# Patient Record
Sex: Female | Born: 1950 | Race: White | Hispanic: No
Health system: Midwestern US, Academic
[De-identification: ages and names within clinical notes are randomized; demographics above are authoritative.]

---

## 2017-03-03 ENCOUNTER — Ambulatory Visit: Admit: 2017-03-03 | Discharge: 2017-03-04

## 2017-03-03 ENCOUNTER — Encounter: Admit: 2017-03-03 | Discharge: 2017-03-03

## 2017-03-03 ENCOUNTER — Ambulatory Visit: Admit: 2017-03-03 | Discharge: 2017-03-03

## 2017-03-03 ENCOUNTER — Ambulatory Visit: Admit: 2017-03-03 | Discharge: 2017-03-03 | Payer: MEDICARE

## 2017-03-03 DIAGNOSIS — C649 Malignant neoplasm of unspecified kidney, except renal pelvis: ICD-10-CM

## 2017-03-03 DIAGNOSIS — F419 Anxiety disorder, unspecified: Principal | ICD-10-CM

## 2017-03-03 DIAGNOSIS — I10 Essential (primary) hypertension: ICD-10-CM

## 2017-03-03 DIAGNOSIS — M1990 Unspecified osteoarthritis, unspecified site: ICD-10-CM

## 2017-03-03 DIAGNOSIS — N200 Calculus of kidney: ICD-10-CM

## 2017-03-03 DIAGNOSIS — C642 Malignant neoplasm of left kidney, except renal pelvis: Principal | ICD-10-CM

## 2017-03-03 DIAGNOSIS — R300 Dysuria: ICD-10-CM

## 2017-03-03 DIAGNOSIS — I639 Cerebral infarction, unspecified: ICD-10-CM

## 2017-03-03 DIAGNOSIS — E079 Disorder of thyroid, unspecified: ICD-10-CM

## 2017-03-03 DIAGNOSIS — N3946 Mixed incontinence: ICD-10-CM

## 2017-03-03 DIAGNOSIS — F329 Major depressive disorder, single episode, unspecified: ICD-10-CM

## 2017-03-03 LAB — COMPREHENSIVE METABOLIC PANEL
Lab: 0.5 mg/dL (ref 0.3–1.2)
Lab: 0.9 mg/dL (ref 0.4–1.00)
Lab: 10 mg/dL (ref 8.5–10.6)
Lab: 103 MMOL/L (ref 98–110)
Lab: 107 mg/dL — ABNORMAL HIGH (ref 70–100)
Lab: 137 MMOL/L (ref 137–147)
Lab: 17 U/L (ref 7–56)
Lab: 19 U/L (ref 7–40)
Lab: 21 mg/dL (ref 7–25)
Lab: 26 MMOL/L (ref 21–30)
Lab: 4.2 MMOL/L (ref 3.5–5.1)
Lab: 4.4 g/dL (ref 3.5–5.0)
Lab: 57 mL/min — ABNORMAL LOW (ref >60)
Lab: 67 U/L (ref 25–110)
Lab: 7.3 g/dL (ref 6.0–8.0)
Lab: >60 mL/min (ref >60)
Lab: 8 (ref 3–12)

## 2017-03-03 LAB — URINALYSIS DIPSTICK
Lab: NEGATIVE
Lab: NEGATIVE
Lab: NEGATIVE
Lab: NEGATIVE
Lab: NEGATIVE
Lab: NEGATIVE

## 2017-03-03 LAB — URINALYSIS, MICROSCOPIC

## 2017-03-03 LAB — POC CREATININE, RAD: Lab: 1 mg/dL (ref 0.4–1.00)

## 2017-03-03 MED ORDER — IOPAMIDOL 76 % IV SOLN
80 mL | Freq: Once | INTRAVENOUS | 0 refills | Status: CP
Start: 2017-03-03 — End: ?
  Administered 2017-03-03: 15:00:00 80 mL via INTRAVENOUS

## 2017-03-03 MED ORDER — SODIUM CHLORIDE 0.9 % IJ SOLN
50 mL | Freq: Once | INTRAVENOUS | 0 refills | Status: CP
Start: 2017-03-03 — End: ?
  Administered 2017-03-03: 15:00:00 50 mL via INTRAVENOUS

## 2017-03-03 NOTE — Progress Notes
pvr 68mL

## 2017-03-03 NOTE — Progress Notes
Date of Service: 03/03/2017     Subjective:             Beverly Duarte is a 66 y.o. 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  She is now nearly 4 years out from surgery.  She presents today with a CT scan, CXR, and labs prior.    Patient overall doing well.  No hematuria or flank pain.  She does report a 12# weight loss in the last 3 weeks due to decreased appetite.  She also reports some worsening urge incontinence for which she wears 2 pads a day.  No dysuria.  Nocturia 2-3 times per night.  We have recommended trials of anticholinergics in the past, but patient has declined.    PVR checked today at 17 ml???       Review of Systems   Constitutional: Negative for activity change, appetite change, chills, diaphoresis, fatigue, fever and unexpected weight change.   HENT: Negative for congestion, hearing loss, mouth sores and sinus pressure.    Eyes: Negative for visual disturbance.   Respiratory: Negative for apnea, cough, chest tightness and shortness of breath.    Cardiovascular: Negative for chest pain, palpitations and leg swelling.   Gastrointestinal: Negative for abdominal pain, blood in stool, constipation, diarrhea, nausea, rectal pain and vomiting.   Genitourinary: Negative for decreased urine volume, difficulty urinating, dyspareunia, dysuria, enuresis, flank pain, frequency, genital sores, hematuria, menstrual problem, pelvic pain, urgency, vaginal bleeding, vaginal discharge and vaginal pain.   Musculoskeletal: Negative for arthralgias, back pain, gait problem and myalgias.   Skin: Negative for rash and wound.   Neurological: Negative for dizziness, tremors, seizures, syncope, weakness, light-headedness, numbness and headaches. Hematological: Negative for adenopathy. Does not bruise/bleed easily.   Psychiatric/Behavioral: Negative for decreased concentration and dysphoric mood. The patient is not nervous/anxious.          Objective:         ??? triamterene-hydrochlorothiazide (DYAZIDE) 37.5-25 mg capsule Take 1 Cap by mouth every morning.     Vitals:    03/03/17 1024   BP: 135/82   Pulse: 64   Weight: 94 kg (207 lb 3.2 oz)   Height: 167.6 cm (66)     Body mass index is 33.44 kg/m???.     Physical Exam   Constitutional: She is oriented to person, place, and time. She appears well-developed and well-nourished. No distress.   HENT:   Head: Normocephalic and atraumatic.   Eyes: Conjunctivae and EOM are normal. No scleral icterus.   Neck: Normal range of motion. Neck supple.   Cardiovascular: Normal rate and regular rhythm.    Pulmonary/Chest: Effort normal and breath sounds normal.   Abdominal: Soft. Bowel sounds are normal. She exhibits no distension. There is no tenderness.   Incision well healed   Genitourinary: No vaginal discharge found.   Musculoskeletal: Normal range of motion. She exhibits no edema.   Neurological: She is alert and oriented to person, place, and time.   Skin: Skin is warm and dry. She is not diaphoretic.   Psychiatric: She has a normal mood and affect. Her behavior is normal. Thought content normal.     Comprehensive Metabolic Profile    Lab Results   Component Value Date/Time    NA  137 03/03/2017 09:08 AM    K 4.2 03/03/2017 09:08 AM    CL 103 03/03/2017 09:08 AM    CO2 26 03/03/2017 09:08 AM    GAP 8 03/03/2017 09:08 AM    BUN 21 03/03/2017 09:08 AM    CR 0.98 03/03/2017 09:08 AM    GLU 107 (H) 03/03/2017 09:08 AM    Lab Results   Component Value Date/Time    CA 10.0 03/03/2017 09:08 AM    ALBUMIN 4.4 03/03/2017 09:08 AM    TOTPROT 7.3 03/03/2017 09:08 AM    ALKPHOS 67 03/03/2017 09:08 AM    AST 19 03/03/2017 09:08 AM    ALT 17 03/03/2017 09:08 AM    TOTBILI 0.5 03/03/2017 09:08 AM    GFR 57 (L) 03/03/2017 09:08 AM GFRAA >60 03/03/2017 09:08 AM          CXR (03/03/17)  IMPRESSION  No acute abnormality.    CT Scan (03/03/17)  IMPRESSION  1. ???Prior left nephrectomy without recurrent left renal fossa mass or   abdominopelvic metastatic disease.     Assessment and Plan:    Problem   Mixed Stress and Urge Urinary Incontinence    MUI requiring 2-3 thin pads/day; also bothered by nocturia  (+) lower extremity edema on exam    PVR 02/13/15: 21 mls    03/03/17: PVR 17.  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 Did not get CXR    03/03/17: No evidence of recurrence on exam, labs, CXR, or CT scan         Renal cell carcinoma [facility])  Patient doing well.  No evidence of recurrence.  She will need continued surveillance.  -- F/U one year with labs, CXR, and CT scan  -- Warnings given to patient who expressed understanding    Mixed stress and urge urinary incontinence  Patient declines any medication at this time.  We discussed urge suppression and timed/doubling voiding.  -- Will call if she wants further evaluation  -- Will perform symptom check in one year if not earlier

## 2017-03-24 NOTE — Assessment & Plan Note
Patient doing well.  No evidence of recurrence.  She will need continued surveillance.  -- F/U one year with labs, CXR, and CT scan  -- Warnings given to patient who expressed understanding

## 2017-03-24 NOTE — Assessment & Plan Note
Patient declines any medication at this time.  We discussed urge suppression and timed/doubling voiding.  -- Will call if she wants further evaluation  -- Will perform symptom check in one year if not earlier

## 2018-01-05 ENCOUNTER — Encounter: Admit: 2018-01-05 | Discharge: 2018-01-05

## 2018-02-17 ENCOUNTER — Encounter: Admit: 2018-02-17 | Discharge: 2018-02-17

## 2018-04-20 ENCOUNTER — Encounter: Admit: 2018-04-20 | Discharge: 2018-04-20

## 2018-06-15 ENCOUNTER — Ambulatory Visit: Admit: 2018-06-15 | Discharge: 2018-06-15

## 2018-06-15 ENCOUNTER — Encounter: Admit: 2018-06-15 | Discharge: 2018-06-15

## 2018-06-15 ENCOUNTER — Ambulatory Visit: Admit: 2018-06-15 | Discharge: 2018-06-15 | Payer: MEDICARE

## 2018-06-15 DIAGNOSIS — C649 Malignant neoplasm of unspecified kidney, except renal pelvis: ICD-10-CM

## 2018-06-15 DIAGNOSIS — F419 Anxiety disorder, unspecified: Principal | ICD-10-CM

## 2018-06-15 DIAGNOSIS — C642 Malignant neoplasm of left kidney, except renal pelvis: Principal | ICD-10-CM

## 2018-06-15 DIAGNOSIS — E079 Disorder of thyroid, unspecified: ICD-10-CM

## 2018-06-15 DIAGNOSIS — I639 Cerebral infarction, unspecified: ICD-10-CM

## 2018-06-15 DIAGNOSIS — I10 Essential (primary) hypertension: ICD-10-CM

## 2018-06-15 DIAGNOSIS — M1990 Unspecified osteoarthritis, unspecified site: ICD-10-CM

## 2018-06-15 DIAGNOSIS — N200 Calculus of kidney: ICD-10-CM

## 2018-06-15 DIAGNOSIS — F329 Major depressive disorder, single episode, unspecified: ICD-10-CM

## 2018-06-15 MED ORDER — IOHEXOL 350 MG IODINE/ML IV SOLN
100 mL | Freq: Once | INTRAVENOUS | 0 refills | Status: CP
Start: 2018-06-15 — End: ?
  Administered 2018-06-15: 15:00:00 100 mL via INTRAVENOUS

## 2018-06-15 MED ORDER — OXYBUTYNIN CHLORIDE 10 MG PO TR24
10 mg | ORAL_TABLET | Freq: Every day | ORAL | 3 refills | 12.00000 days | Status: AC
Start: 2018-06-15 — End: ?

## 2018-06-15 MED ORDER — SODIUM CHLORIDE 0.9 % IJ SOLN
50 mL | Freq: Once | INTRAVENOUS | 0 refills | Status: CP
Start: 2018-06-15 — End: ?
  Administered 2018-06-15: 15:00:00 50 mL via INTRAVENOUS

## 2018-06-20 ENCOUNTER — Encounter: Admit: 2018-06-20 | Discharge: 2018-06-20

## 2018-06-20 DIAGNOSIS — I10 Essential (primary) hypertension: ICD-10-CM

## 2018-06-20 DIAGNOSIS — E079 Disorder of thyroid, unspecified: ICD-10-CM

## 2018-06-20 DIAGNOSIS — M1990 Unspecified osteoarthritis, unspecified site: ICD-10-CM

## 2018-06-20 DIAGNOSIS — F419 Anxiety disorder, unspecified: Principal | ICD-10-CM

## 2018-06-20 DIAGNOSIS — F329 Major depressive disorder, single episode, unspecified: ICD-10-CM

## 2018-06-20 DIAGNOSIS — C649 Malignant neoplasm of unspecified kidney, except renal pelvis: ICD-10-CM

## 2018-06-20 DIAGNOSIS — I639 Cerebral infarction, unspecified: ICD-10-CM

## 2018-06-20 DIAGNOSIS — N200 Calculus of kidney: ICD-10-CM

## 2019-07-14 ENCOUNTER — Encounter: Admit: 2019-07-14 | Discharge: 2019-07-14 | Payer: MEDICARE

## 2019-07-16 ENCOUNTER — Encounter: Admit: 2019-07-16 | Discharge: 2019-07-16 | Payer: MEDICARE

## 2019-10-14 ENCOUNTER — Encounter: Admit: 2019-10-14 | Discharge: 2019-10-14 | Payer: MEDICARE

## 2019-10-14 DIAGNOSIS — C642 Malignant neoplasm of left kidney, except renal pelvis: Secondary | ICD-10-CM

## 2020-09-13 ENCOUNTER — Encounter: Admit: 2020-09-13 | Discharge: 2020-09-13 | Payer: MEDICARE

## 2020-09-13 ENCOUNTER — Ambulatory Visit: Admit: 2020-09-13 | Discharge: 2020-09-13 | Payer: MEDICARE

## 2020-09-13 DIAGNOSIS — R0902 Hypoxemia: Secondary | ICD-10-CM

## 2020-09-13 DIAGNOSIS — F419 Anxiety disorder, unspecified: Secondary | ICD-10-CM

## 2020-09-13 DIAGNOSIS — J982 Interstitial emphysema: Secondary | ICD-10-CM

## 2020-09-13 DIAGNOSIS — R55 Syncope and collapse: Secondary | ICD-10-CM

## 2020-09-13 DIAGNOSIS — J1282 Pneumonia due to coronavirus disease 2019 (CODE): Secondary | ICD-10-CM

## 2020-09-13 DIAGNOSIS — U071 Infection due to 2019-nCoV: Secondary | ICD-10-CM

## 2020-09-13 DIAGNOSIS — R5383 Other fatigue: Secondary | ICD-10-CM

## 2020-12-20 ENCOUNTER — Encounter: Admit: 2020-12-20 | Discharge: 2020-12-20 | Payer: MEDICARE

## 2020-12-31 IMAGING — US ECHOCOMPL
1 series · 13 of 24 positions shown · non-contrast
Comparison: none

[Series 1: us echo 2d, complete · 48 acquisitions, 13 frames shown]
[im 1/48]
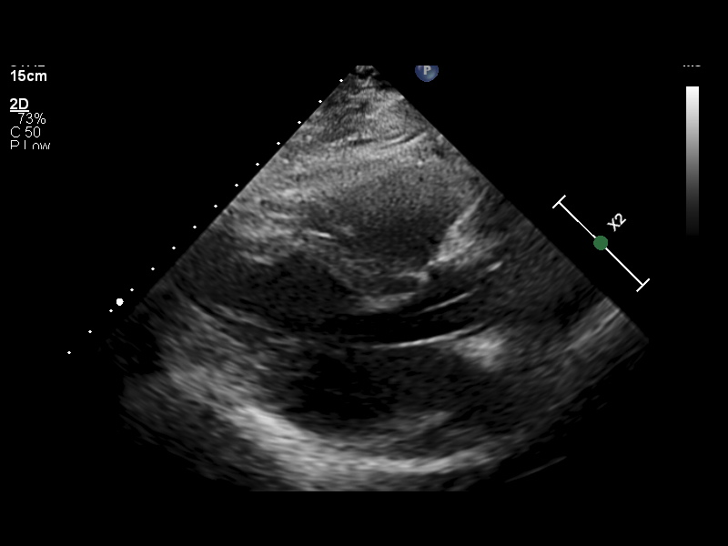
[im 3/48]
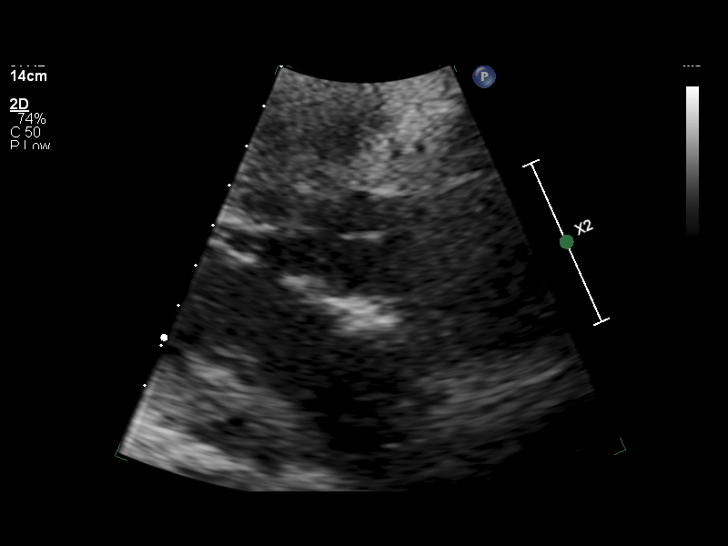
[im 7/48]
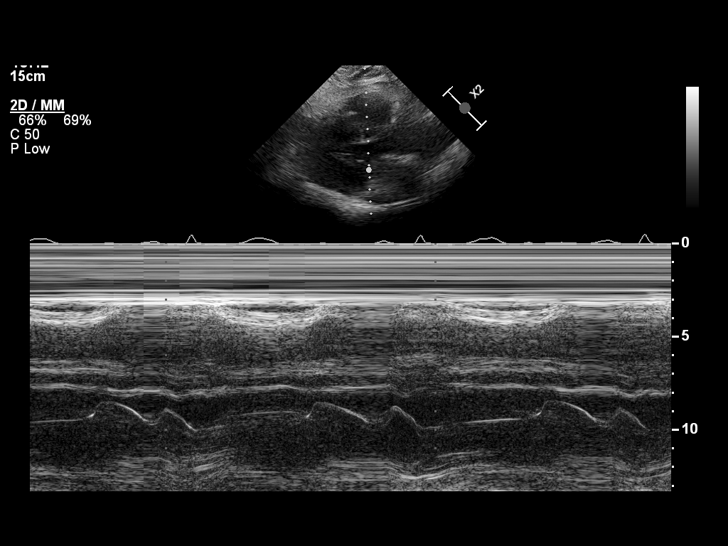
[im 13/48]
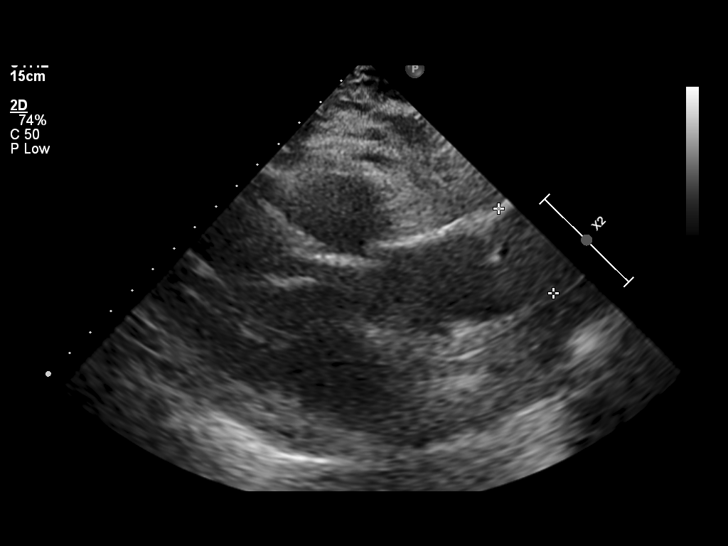
[im 17/48]
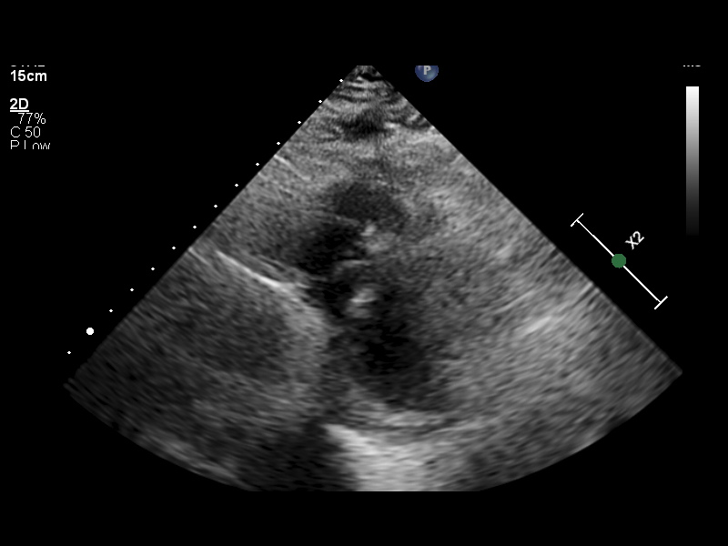
[im 21/48]
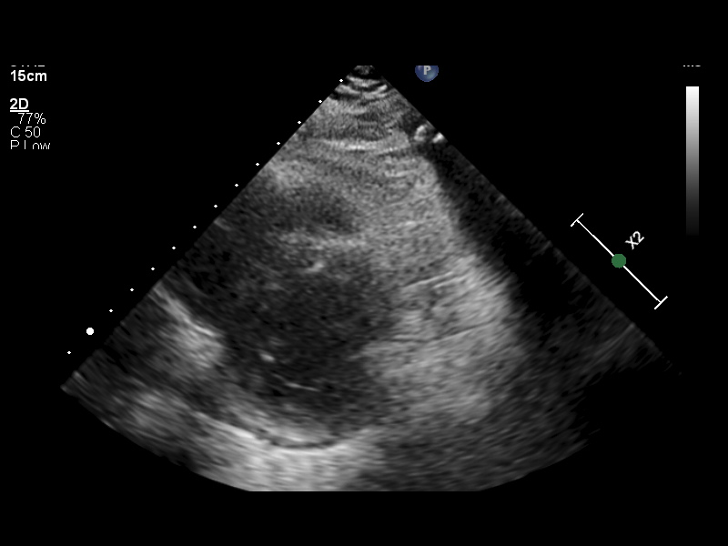
[im 23/48]
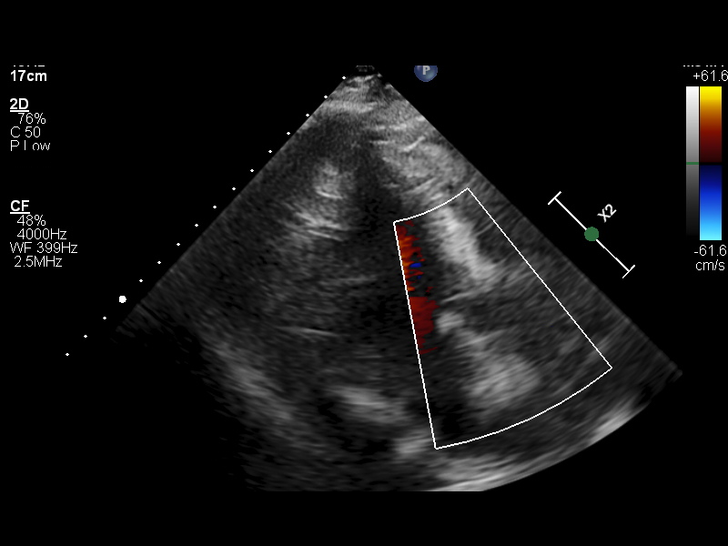
[im 27/48]
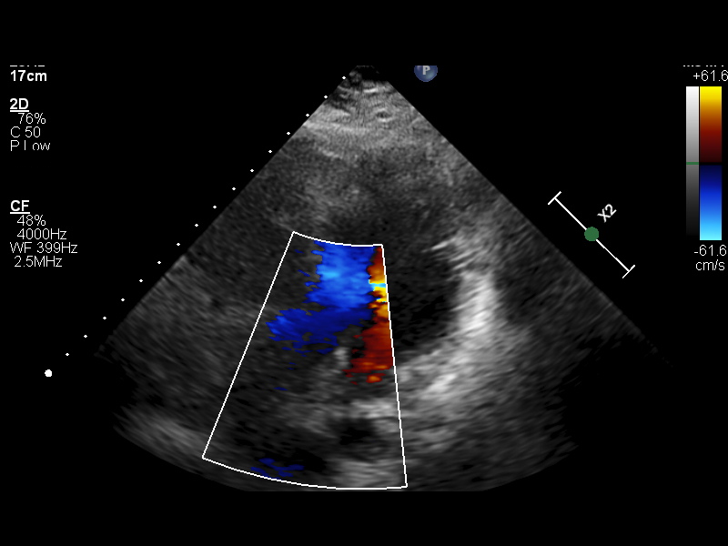
[im 31/48]
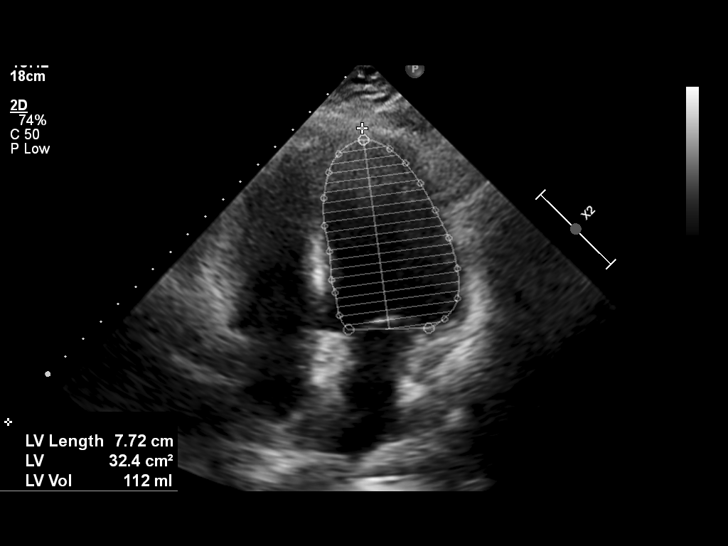
[im 35/48]
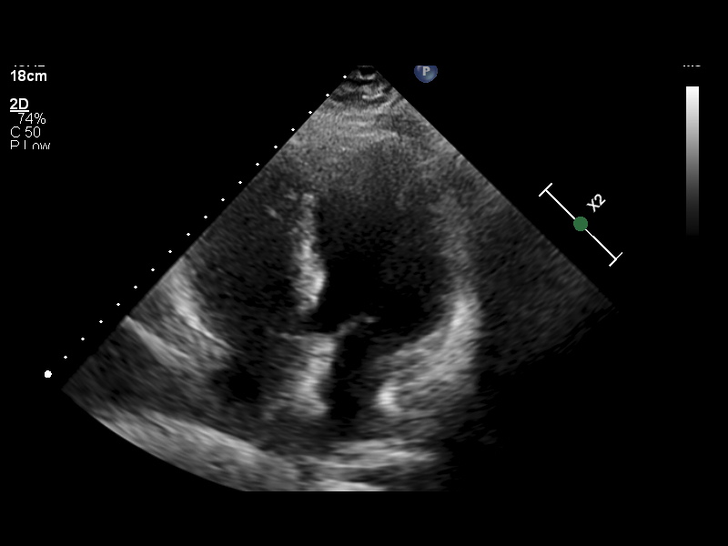
[im 39/48]
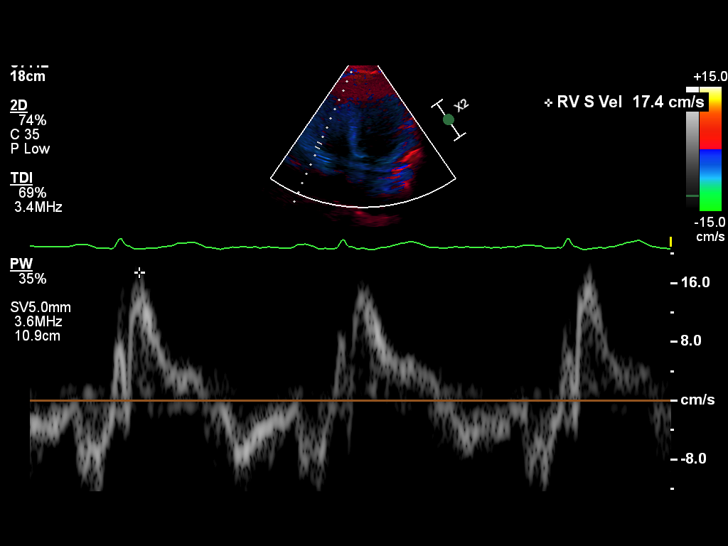
[im 43/48]
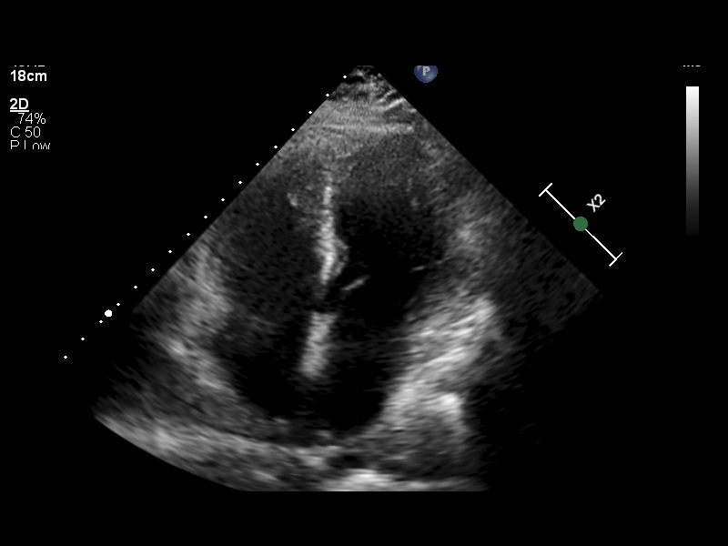
[im 48/48]
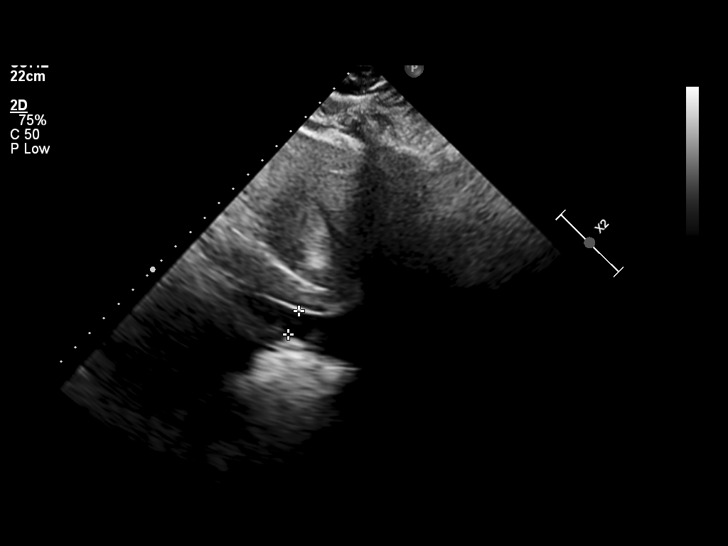

[13 of 24 positions shown; findings below may reference images not displayed]

09/13/20 -  2D + DOPPLER ECHO
Location Performed: [HOSPITAL]

Referring Provider:
Fellow:
Location of Interp:
Sonographer: External Staff

Indications:      Syncope

Vitals
Height   Weight   BSA (Calculated)   BP   Comments
165.1 cm (65")   93.9 kg (207 lb)   2.07   141/75

Interpretation Summary
Normal left ventricular systolic function, estimated ejection fraction is 60%.
Normal left ventricular diastolic function. Normal left atrial pressure.
The right ventricular size, wall thickness and systolic function are normal.
No valvular abnormalities.
Pulmonary artery pressure could not be calculated by this study due to incomplete TR signal.

Echocardiographic Findings
Left Ventricle   Normal left ventricular systolic function, estimated ejection fraction is  60%.
Normal left ventricular diastolic function. Normal left atrial pressure.
Right Ventricle   The right ventricular size, wall thickness and systolic function are normal.
Left Atrium   Normal size.
Right Atrium   Normal size.
IVC/SVC   Normal central venous pressure (0-5 mm Hg).
Mitral Valve   Normal valve structure. No stenosis. No regurgitation.
Tricuspid Valve   Normal valve structure. No stenosis. No regurgitation.
Aortic Valve   Normal valve structure. No stenosis. No regurgitation.
Pulmonary   The pulmonic valve was not well seen.
The pulmonary artery was not well seen.
Aorta   Normal aorta.
Pericardium   No pericardial effusion.

Left Ventricular Wall Scoring
Resting   Score Index: 1.000   Percent Normal: 100.0%

The left ventricular wall motion is normal.

Left Heart 2D Measurements (Normal Ranges)
LVIDD
4.1 cm (Range: 3.8 - 5.2)
LVIDS
3.0 cm (Range: 2.2 - 3.5)
IVS
0.9 cm (Range: 0.6 - 0.9)
LV PW
1.1 cm (Range: 0.6 - 0.9)
LA Size
3.9 cm (Range: 2.7 - 3.8)

Right Heart 2D   M-Mode Measurements (Normal Ranges) (Range)
RV Basal Dia
3.4 cm (2.5 - 4.1)
RV Mid Dia
3.2 cm (1.9 - 3.5)
[REDACTED].5 cm2 (<18)
M-Mode TAPSE
1.9 cm (>1.7)

Left Heart 2D Addnl Measurements (Normal Ranges)
LV Systolic Vol
25 mL (Range: 14 - 42)
LV Systolic Vol Index
12 mL (Range: 8 - 24)
LV Diastolic Vol
95 mL (Range: 46 - 106)
LV Diastolic Vol Index
46 mL (Range: 29 - 61)
LA Vol
44 mL (Range: 22 - 52)
LA Vol Index
21.26 (Range: 16 - 34)
LV Mass
132 g (Range: 67 - 162)
LV Mass Index
64 g/m2 (Range: 43 - 95)
RWT
0.54 (Range: <=0.42)

Aortic Root Measurements (Normal Ranges)
Sinus
2.8 cm (Range: 2.4 - 3.6)
AO Prox
3.4 cm (Range: 1.9 - 3.5)

Tech Notes:

tm

## 2021-01-17 IMAGING — CR [ID]
3 series · 3 of 3 positions shown · non-contrast
Comparison: none

[foot ap]
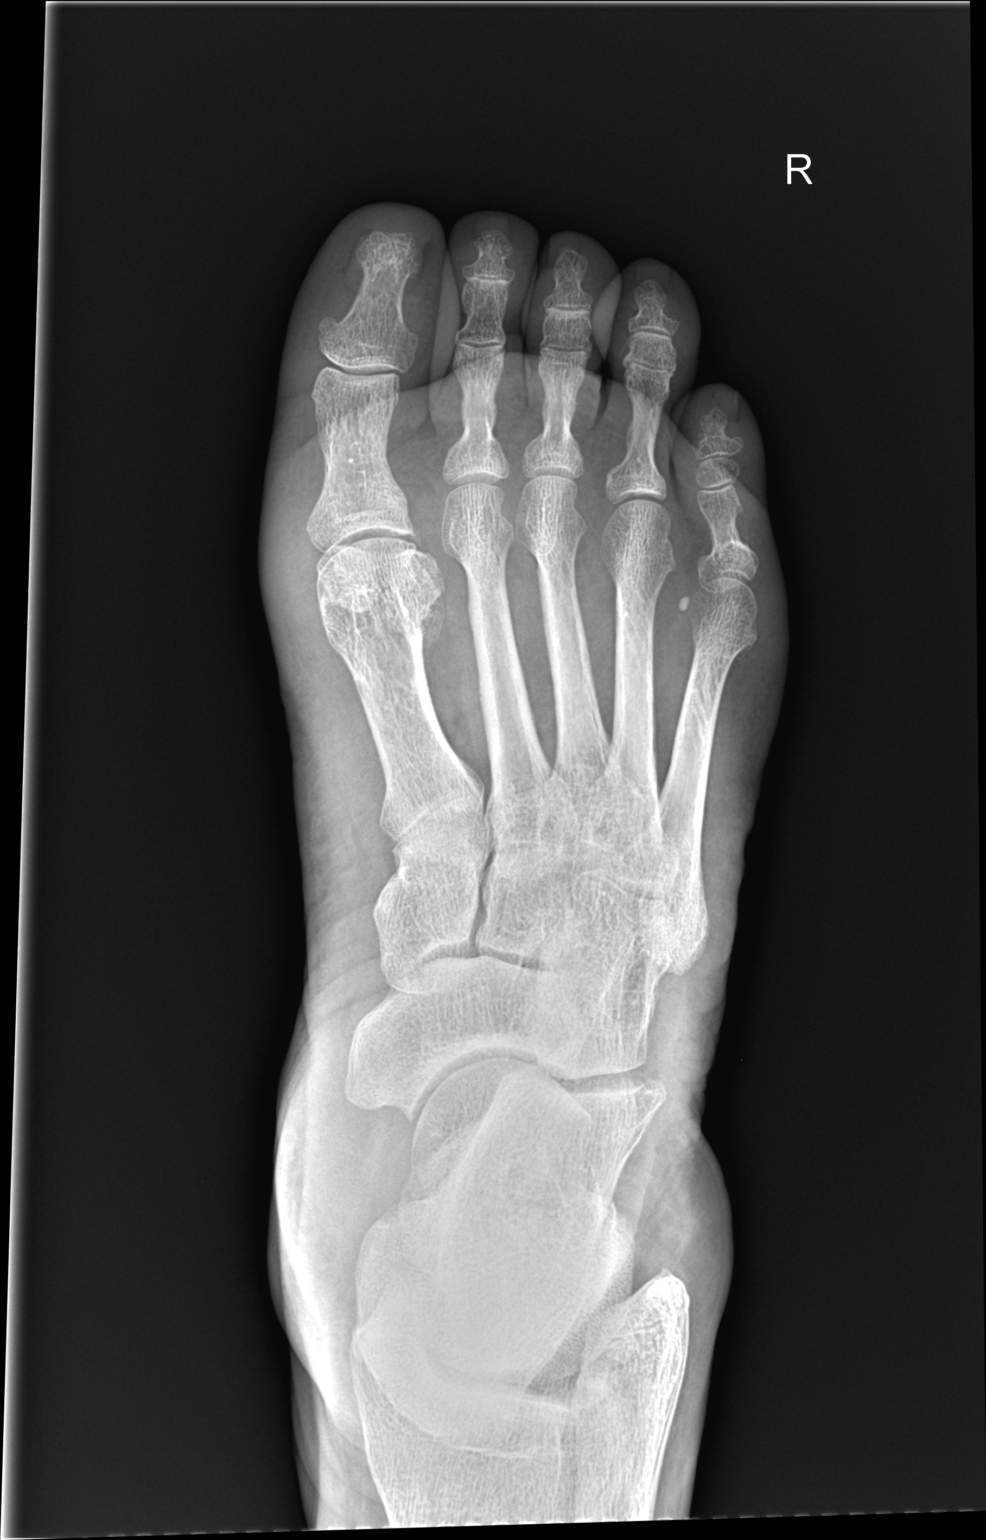

[foot obl]
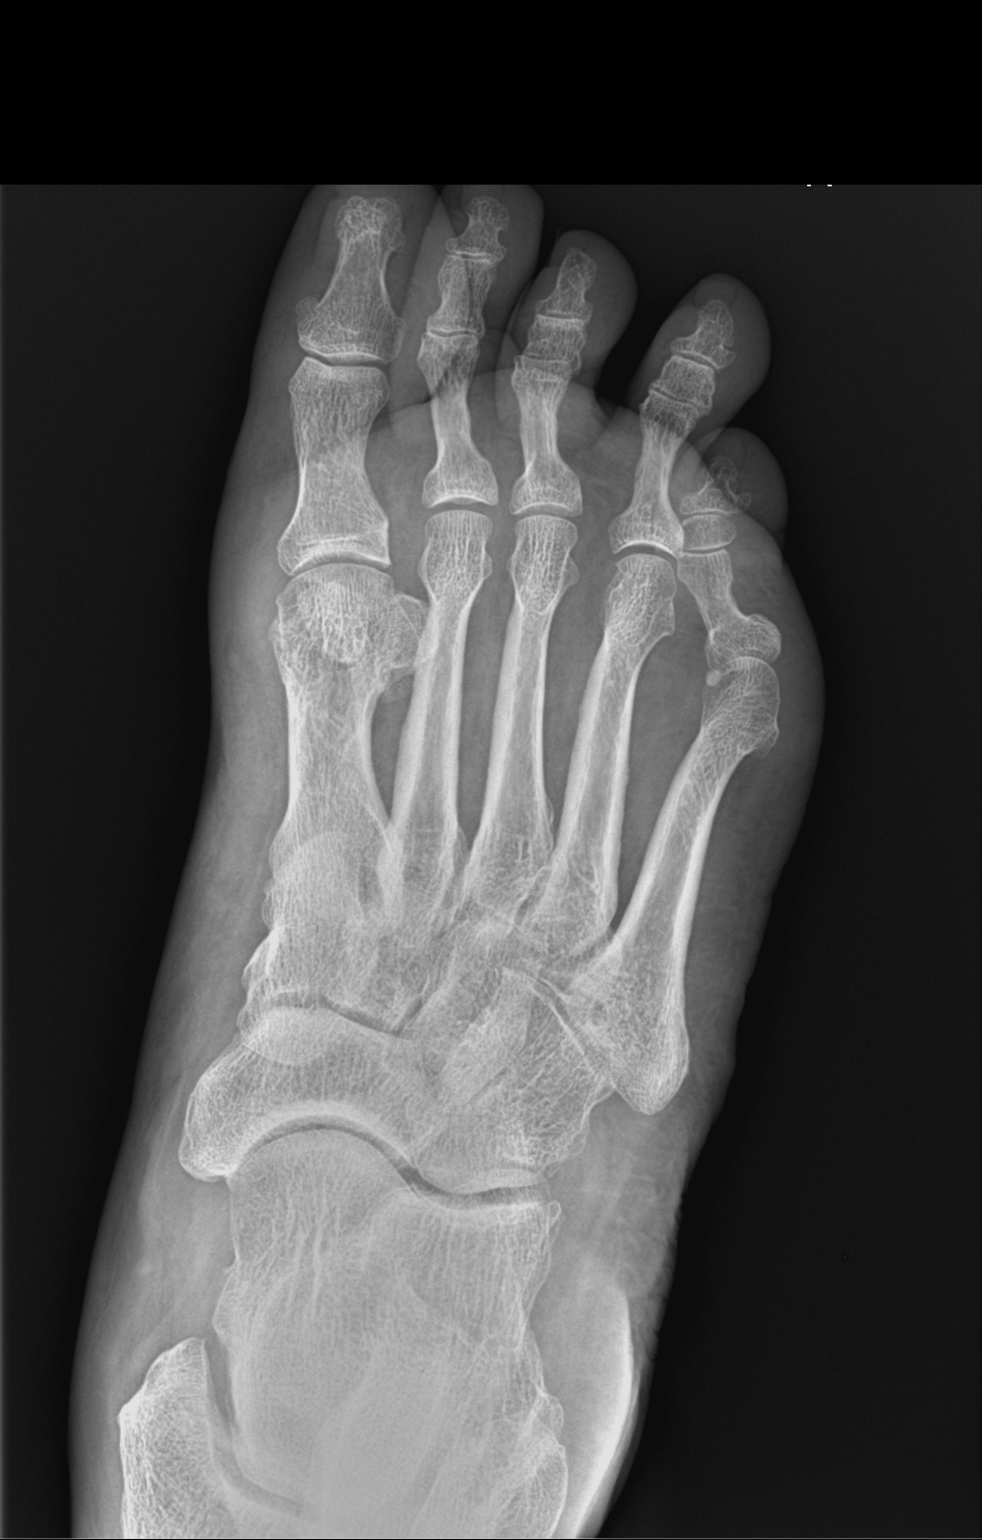

[foot lat]
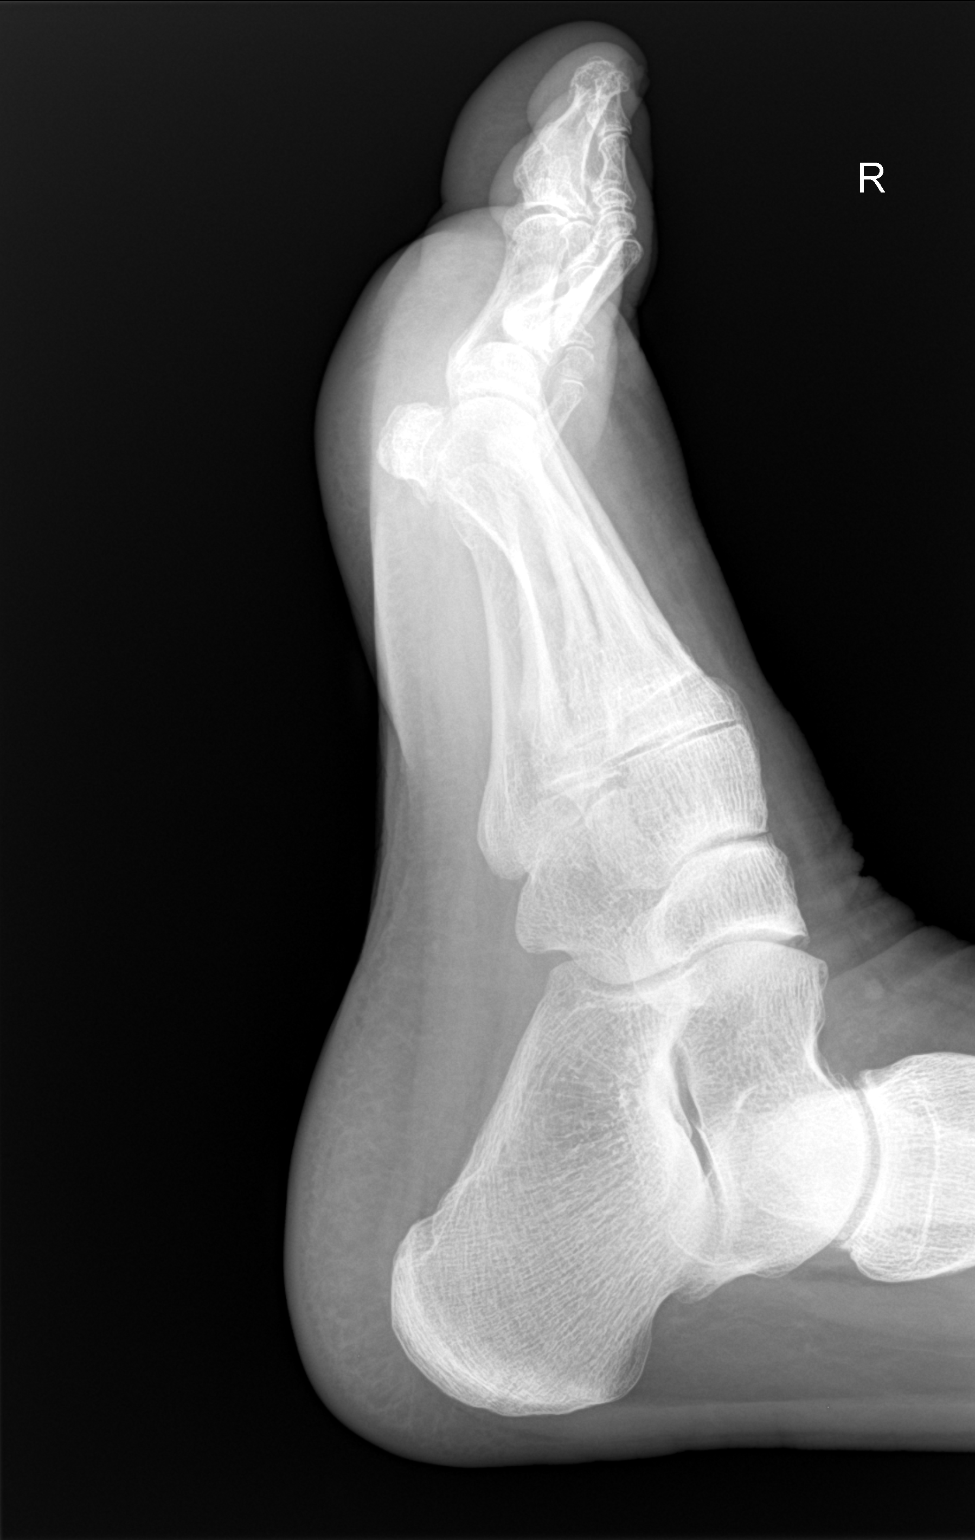

[3 of 3 positions shown; findings below may reference images not displayed]

DIAGNOSTIC STUDIES

EXAM

XR foot RT min 3V

INDICATION

pain 1st mtp red streak
FOOT PAIN THAT STARTED YESTERDAY. RED SWOLLEN AREA NEAR GREAT TOE. RED STREAKS EXTENDING FROM TOE
TOWARD ANKLE. NO TRAUMA/INJURY.   HB

TECHNIQUE

Three views of the right foot were obtained

COMPARISONS

None available

FINDINGS

No acute fracture or dislocation. There are mild degenerative changes involving the midfoot and at
the 1st MTP joint. No osseous erosion or evidence of inflammatory arthritis. Soft tissue edema along
the medial aspect of the 1st MTP joint.

IMPRESSION

No acute osseous abnormality.

Tech Notes:

FOOT PAIN THAT STARTED YESTERDAY. RED SWOLLEN AREA NEAR GREAT TOE. RED STREAKS EXTENDING FROM TOE
TOWARD ANKLE. NO TRAUMA/INJURY.
HB

## 2021-05-18 ENCOUNTER — Encounter: Admit: 2021-05-18 | Discharge: 2021-05-18 | Payer: MEDICARE

## 2021-05-18 DIAGNOSIS — C642 Malignant neoplasm of left kidney, except renal pelvis: Secondary | ICD-10-CM

## 2021-05-24 ENCOUNTER — Encounter: Admit: 2021-05-24 | Discharge: 2021-05-24 | Payer: MEDICARE

## 2021-08-02 ENCOUNTER — Encounter: Admit: 2021-08-02 | Discharge: 2021-08-02 | Payer: MEDICARE

## 2021-08-02 DIAGNOSIS — C642 Malignant neoplasm of left kidney, except renal pelvis: Secondary | ICD-10-CM

## 2021-08-02 DIAGNOSIS — N281 Cyst of kidney, acquired: Secondary | ICD-10-CM

## 2021-08-03 ENCOUNTER — Encounter: Admit: 2021-08-03 | Discharge: 2021-08-03 | Payer: MEDICARE

## 2021-08-20 ENCOUNTER — Encounter: Admit: 2021-08-20 | Discharge: 2021-08-20 | Payer: MEDICARE

## 2021-08-20 NOTE — Telephone Encounter
Received call from patient requesting return call regarding clarification of which imaging she is scheduled to have 08/27/21 prior to her appt with Dr. Morene Crocker. Returned call and spoke to patient. Advised due to insurance/imaging coverage issue, Dr. Morene Crocker has changed orders to [facility] for her imaging. Reminded her to please get a CXR and labs prior to checking in for her appt. She verbalized understanding and thanked me for return call. No further questions.

## 2021-08-27 ENCOUNTER — Ambulatory Visit: Admit: 2021-08-27 | Discharge: 2021-08-27 | Payer: MEDICARE

## 2021-08-27 ENCOUNTER — Encounter: Admit: 2021-08-27 | Discharge: 2021-08-27 | Payer: MEDICARE

## 2021-08-27 DIAGNOSIS — N200 Calculus of kidney: Secondary | ICD-10-CM

## 2021-08-27 DIAGNOSIS — F32A Depression: Secondary | ICD-10-CM

## 2021-08-27 DIAGNOSIS — C649 Malignant neoplasm of unspecified kidney, except renal pelvis: Secondary | ICD-10-CM

## 2021-08-27 DIAGNOSIS — F419 Anxiety disorder, unspecified: Secondary | ICD-10-CM

## 2021-08-27 DIAGNOSIS — I10 Essential (primary) hypertension: Secondary | ICD-10-CM

## 2021-08-27 DIAGNOSIS — E079 Disorder of thyroid, unspecified: Secondary | ICD-10-CM

## 2021-08-27 DIAGNOSIS — C642 Malignant neoplasm of left kidney, except renal pelvis: Secondary | ICD-10-CM

## 2021-08-27 DIAGNOSIS — N281 Cyst of kidney, acquired: Secondary | ICD-10-CM

## 2021-08-27 DIAGNOSIS — I639 Cerebral infarction, unspecified: Secondary | ICD-10-CM

## 2021-08-27 DIAGNOSIS — M1990 Unspecified osteoarthritis, unspecified site: Secondary | ICD-10-CM

## 2021-08-27 LAB — COMPREHENSIVE METABOLIC PANEL
ALBUMIN: 4.1 g/dL (ref 3.5–5.0)
ALK PHOSPHATASE: 73 U/L (ref 25–110)
ALT: 18 U/L (ref 7–56)
ANION GAP: 10 (ref 3–12)
AST: 20 U/L (ref 7–40)
BLD UREA NITROGEN: 27 mg/dL — ABNORMAL HIGH (ref 7–25)
CALCIUM: 9.8 mg/dL (ref 8.5–10.6)
CHLORIDE: 103 MMOL/L (ref 98–110)
CO2: 27 MMOL/L (ref 21–30)
CREATININE: 1.2 mg/dL — ABNORMAL HIGH (ref 0.4–1.00)
EGFR: 46 mL/min — ABNORMAL LOW (ref >60)
GLUCOSE,PANEL: 99 mg/dL (ref 70–100)
POTASSIUM: 4.3 MMOL/L (ref 3.5–5.1)
SODIUM: 140 MMOL/L (ref 137–147)
TOTAL BILIRUBIN: 0.5 mg/dL (ref 0.3–1.2)
TOTAL PROTEIN: 6.8 g/dL (ref 6.0–8.0)

## 2021-11-26 ENCOUNTER — Encounter: Admit: 2021-11-26 | Discharge: 2021-11-26 | Payer: MEDICARE

## 2021-11-26 ENCOUNTER — Ambulatory Visit: Admit: 2021-11-26 | Discharge: 2021-11-26 | Payer: MEDICARE

## 2021-11-26 DIAGNOSIS — E079 Disorder of thyroid, unspecified: Secondary | ICD-10-CM

## 2021-11-26 DIAGNOSIS — F32A Depression: Secondary | ICD-10-CM

## 2021-11-26 DIAGNOSIS — F419 Anxiety disorder, unspecified: Secondary | ICD-10-CM

## 2021-11-26 DIAGNOSIS — C649 Malignant neoplasm of unspecified kidney, except renal pelvis: Secondary | ICD-10-CM

## 2021-11-26 DIAGNOSIS — I10 Essential (primary) hypertension: Secondary | ICD-10-CM

## 2021-11-26 DIAGNOSIS — C642 Malignant neoplasm of left kidney, except renal pelvis: Secondary | ICD-10-CM

## 2021-11-26 DIAGNOSIS — N200 Calculus of kidney: Secondary | ICD-10-CM

## 2021-11-26 DIAGNOSIS — M1990 Unspecified osteoarthritis, unspecified site: Secondary | ICD-10-CM

## 2021-11-26 DIAGNOSIS — I639 Cerebral infarction, unspecified: Secondary | ICD-10-CM

## 2021-11-26 LAB — POC CREATININE, RAD: CREATININE, POC: 1.2 mg/dL — ABNORMAL HIGH (ref 0.4–1.00)

## 2021-11-26 MED ORDER — SODIUM CHLORIDE 0.9 % IJ SOLN
50 mL | Freq: Once | INTRAVENOUS | 0 refills | Status: CP
Start: 2021-11-26 — End: ?
  Administered 2021-11-26: 15:00:00 50 mL via INTRAVENOUS

## 2021-11-26 MED ORDER — IOHEXOL 350 MG IODINE/ML IV SOLN
100 mL | Freq: Once | INTRAVENOUS | 0 refills | Status: CP
Start: 2021-11-26 — End: ?
  Administered 2021-11-26: 15:00:00 100 mL via INTRAVENOUS

## 2022-06-04 ENCOUNTER — Encounter: Admit: 2022-06-04 | Discharge: 2022-06-04 | Payer: MEDICARE

## 2022-12-09 ENCOUNTER — Ambulatory Visit: Admit: 2022-12-09 | Discharge: 2022-12-09 | Payer: MEDICARE

## 2022-12-09 ENCOUNTER — Encounter: Admit: 2022-12-09 | Discharge: 2022-12-09 | Payer: MEDICARE

## 2022-12-09 DIAGNOSIS — C642 Malignant neoplasm of left kidney, except renal pelvis: Secondary | ICD-10-CM

## 2022-12-09 DIAGNOSIS — C649 Malignant neoplasm of unspecified kidney, except renal pelvis: Secondary | ICD-10-CM

## 2022-12-09 DIAGNOSIS — F32A Depression: Secondary | ICD-10-CM

## 2022-12-09 DIAGNOSIS — F419 Anxiety disorder, unspecified: Secondary | ICD-10-CM

## 2022-12-09 DIAGNOSIS — N200 Calculus of kidney: Secondary | ICD-10-CM

## 2022-12-09 DIAGNOSIS — M1990 Unspecified osteoarthritis, unspecified site: Secondary | ICD-10-CM

## 2022-12-09 DIAGNOSIS — I10 Essential (primary) hypertension: Secondary | ICD-10-CM

## 2022-12-09 DIAGNOSIS — E079 Disorder of thyroid, unspecified: Secondary | ICD-10-CM

## 2022-12-09 DIAGNOSIS — I639 Cerebral infarction, unspecified: Secondary | ICD-10-CM

## 2022-12-09 LAB — COMPREHENSIVE METABOLIC PANEL
ALBUMIN: 4.3 g/dL (ref 3.5–5.0)
ALK PHOSPHATASE: 63 U/L — ABNORMAL LOW (ref 25–110)
ALT: 14 U/L (ref 7–56)
ANION GAP: 9 K/UL (ref 3–12)
AST: 15 U/L — ABNORMAL LOW (ref 7–40)
BLD UREA NITROGEN: 26 mg/dL — ABNORMAL HIGH (ref 7–25)
CALCIUM: 9.4 mg/dL — ABNORMAL LOW (ref 8.5–10.6)
CHLORIDE: 102 MMOL/L — ABNORMAL HIGH (ref 98–110)
CO2: 28 MMOL/L (ref 21–30)
CREATININE: 1.1 mg/dL — ABNORMAL HIGH (ref 0.4–1.00)
EGFR: 52 mL/min — ABNORMAL LOW (ref >60)
GLUCOSE,PANEL: 95 mg/dL — ABNORMAL LOW (ref 70–100)
POTASSIUM: 3.6 MMOL/L (ref 3.5–5.1)
SODIUM: 139 MMOL/L (ref 137–147)
TOTAL BILIRUBIN: 0.5 mg/dL (ref >60)
TOTAL PROTEIN: 6.9 g/dL (ref 6.0–8.0)

## 2022-12-09 LAB — POC CREATININE, RAD: CREATININE, POC: 1.1 mg/dL — ABNORMAL HIGH (ref 0.4–1.00)

## 2022-12-09 MED ORDER — IOHEXOL 350 MG IODINE/ML IV SOLN
100 mL | Freq: Once | INTRAVENOUS | 0 refills | Status: CP
Start: 2022-12-09 — End: ?
  Administered 2022-12-09: 13:00:00 100 mL via INTRAVENOUS

## 2022-12-09 MED ORDER — SODIUM CHLORIDE 0.9 % IJ SOLN
50 mL | Freq: Once | INTRAVENOUS | 0 refills | Status: CP
Start: 2022-12-09 — End: ?
  Administered 2022-12-09: 13:00:00 50 mL via INTRAVENOUS

## 2024-01-21 ENCOUNTER — Encounter: Admit: 2024-01-21 | Discharge: 2024-01-21 | Payer: MEDICARE
